# Patient Record
Sex: Female | Born: 1970 | Race: Black or African American | Hispanic: No | Marital: Single | Smoking: Never smoker
Health system: Southern US, Community
[De-identification: ages and names within clinical notes are randomized; demographics above are authoritative.]

## PROBLEM LIST (undated history)

## (undated) DIAGNOSIS — Z789 Other specified health status: Secondary | ICD-10-CM

## (undated) HISTORY — PX: OTHER SURGICAL HISTORY: SHX169

## (undated) HISTORY — DX: Other specified health status: Z78.9

---

## 2012-05-07 ENCOUNTER — Emergency Department: Payer: Self-pay | Admitting: Emergency Medicine

## 2012-05-07 LAB — BASIC METABOLIC PANEL
BUN: 8 mg/dL (ref 7–18)
Calcium, Total: 8.9 mg/dL (ref 8.5–10.1)
Chloride: 107 mmol/L (ref 98–107)
Co2: 27 mmol/L (ref 21–32)
EGFR (African American): >60
Glucose: 90 mg/dL (ref 65–99)
Osmolality: 281 (ref 275–301)
Potassium: 3.9 mmol/L (ref 3.5–5.1)

## 2012-05-07 LAB — CBC
HGB: 12.4 g/dL (ref 12.0–16.0)
MCH: 30.7 pg (ref 26.0–34.0)
MCHC: 34.8 g/dL (ref 32.0–36.0)
WBC: 7.7 10*3/uL (ref 3.6–11.0)

## 2012-06-09 ENCOUNTER — Ambulatory Visit: Payer: Self-pay

## 2013-06-14 ENCOUNTER — Ambulatory Visit: Payer: Self-pay

## 2018-03-09 ENCOUNTER — Encounter: Payer: Self-pay | Admitting: Podiatry

## 2018-03-13 NOTE — Progress Notes (Signed)
This encounter was created in error - please disregard.

## 2018-09-08 ENCOUNTER — Encounter: Payer: Self-pay | Admitting: *Deleted

## 2018-09-08 ENCOUNTER — Ambulatory Visit: Payer: Self-pay | Attending: Oncology | Admitting: *Deleted

## 2018-09-08 ENCOUNTER — Encounter (INDEPENDENT_AMBULATORY_CARE_PROVIDER_SITE_OTHER): Payer: Self-pay

## 2018-09-08 VITALS — BP 129/83 | HR 76 | Temp 97.9°F | Ht 67.75 in | Wt 126.2 lb

## 2018-09-08 DIAGNOSIS — N63 Unspecified lump in unspecified breast: Secondary | ICD-10-CM

## 2018-09-08 NOTE — Progress Notes (Signed)
  Subjective:     Patient ID: Christina Navarro, female   DOB: Aug 28, 1970, 48 y.o.   MRN: [id number]  HPI   Review of Systems     Objective:   Physical Exam Chest:     Breasts:        Right: No swelling, bleeding, inverted nipple, mass, nipple discharge, skin change or tenderness.        Left: Mass and tenderness present. No swelling, bleeding, inverted nipple, nipple discharge or skin change.          Assessment:     48 year old Black female referred to [facility] by [facility] for screening mammogram.  Patient complains of left breast pain.  States she noticed the pain about 2 years ago after her daughters accident.  Her daughter is now a quadriplegic.  States it's intermittent and rates a 4 1/2 to 5 on a 0/10 scale.  No aggravating or alleviating factors.  States "Its just hard to explain, but I am just more aware of it now."  Family history of breast cancer in 3 paternal aunts.  On clinical breast exam I can palpate a very tender approximate 0.5cm at 1:00 left breast 7 cm from the nipple.  This is the area of the patients targeted pain.  Taught self breast awareness.  Last pap per notes from [facility] states normal pap in April 2019.  Next pap will be due per guidelines.  Patient has been screened for eligibility.  She does not have any insurance, Medicare or Medicaid.  She also meets financial eligibility.  Hand-out given on the Affordable Care Act. Risk Assessment    Risk Scores      09/08/2018   Last edited by: Alta Corning, CMA   5-year risk: 0.9 %   Lifetime risk: 9.2 %              Plan:     Will get bilateral diagnostic mammogram and ultrasound.  Will follow-up per [facility] protocol.

## 2018-09-08 NOTE — Patient Instructions (Signed)
Gave patient hand-out, Women Staying Healthy, Active and Well from [facility], with education on breast health, pap smears, heart and colon health. 

## 2018-09-15 ENCOUNTER — Ambulatory Visit
Admission: RE | Admit: 2018-09-15 | Discharge: 2018-09-15 | Disposition: A | Discharge status: Home / Self Care | Payer: Self-pay | Source: Ambulatory Visit | Attending: Oncology | Admitting: Oncology

## 2018-09-15 ENCOUNTER — Encounter (HOSPITAL_COMMUNITY): Payer: Self-pay

## 2018-09-15 DIAGNOSIS — N63 Unspecified lump in unspecified breast: Secondary | ICD-10-CM

## 2018-09-16 ENCOUNTER — Encounter: Payer: Self-pay | Admitting: *Deleted

## 2018-09-16 NOTE — Progress Notes (Signed)
Called patient to review her normal mammogram results.  States she is still have targeted breast pain.  Discussed that since there was a palpable finding at 1:00 left breast at the site of targeted pain, I felt it would be prudent to send her for a surgical consult.  Patient is agreeable.  She is scheduled to see Dr. Lemar Livings on 09/23/18 @ 1:30.

## 2018-09-23 ENCOUNTER — Ambulatory Visit: Payer: Self-pay | Admitting: General Surgery

## 2018-10-05 ENCOUNTER — Encounter: Payer: Self-pay | Admitting: General Surgery

## 2018-10-05 ENCOUNTER — Other Ambulatory Visit: Payer: Self-pay

## 2018-10-05 ENCOUNTER — Ambulatory Visit: Payer: Self-pay

## 2018-10-05 ENCOUNTER — Ambulatory Visit (INDEPENDENT_AMBULATORY_CARE_PROVIDER_SITE_OTHER): Payer: Self-pay | Admitting: General Surgery

## 2018-10-05 VITALS — BP 109/66 | HR 81 | Temp 97.7°F | Resp 16 | Ht 67.0 in | Wt 230.6 lb

## 2018-10-05 DIAGNOSIS — N6321 Unspecified lump in the left breast, upper outer quadrant: Secondary | ICD-10-CM

## 2018-10-05 NOTE — Progress Notes (Signed)
Patient ID: Christina Navarro, female   DOB: 1970-08-30, 48 y.o.   MRN: [id number]  Chief Complaint  Patient presents with  . Breast Problem    left breast    HPI Christina Navarro is a 48 y.o. female.  who presents for a breast evaluation referred by Molli Posey RN with [facility]. The most recent mammogram and left breast ultrasound was done on 09-15-18.  Patient does perform regular self breast checks and gets regular mammograms done.   She states she noticed some discomfort in upper left breast on and off for about 2 years. She thought it occurred more with her menstrual cycles. [name] did notice a lump on exam. She is caregiver to her daughter who is a quadriplegic from a MVA. 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   HPI  Past Medical History:  Diagnosis Date  . Patient denies medical problems     Past Surgical History:  Procedure Laterality Date  . denies      Family History  Problem Relation Age of Onset  . Breast cancer Paternal Aunt        x5  . Colon cancer Neg Hx     Social History Social History   Tobacco Use  . Smoking status: Never Smoker  . Smokeless tobacco: Never Used  Substance Use Topics  . Alcohol use: Never    Frequency: Never  . Drug use: Never    No Known Allergies  Current Outpatient Medications  Medication Sig Dispense Refill  . Ascorbic Acid (VITAMIN C) 1000 MG tablet Take 1,000 mg by mouth daily.    . calcium-vitamin D (OSCAL WITH D) 500-200 MG-UNIT tablet Take 1 tablet by mouth daily.    . Cinnamon 500 MG capsule Take 500 mg by mouth daily.    . Multiple Vitamin (MULTIVITAMIN) capsule Take 1 capsule by mouth daily.    . vitamin B-12 (CYANOCOBALAMIN) 100 MCG tablet Take 100 mcg by mouth daily.     No current facility-administered medications for this visit.     Review of Systems Review of Systems  Constitutional: Negative.   Respiratory: Negative.   Cardiovascular: Negative.     Blood pressure 109/66, pulse 81, temperature 97.7 F (36.5 C),  temperature source Skin, resp. rate 16, height 5\' 7"  (1.702 m), weight 230 lb 9.6 oz (104.6 kg), last menstrual period 10/05/2018, SpO2 97 %.  Physical Exam Physical Exam Exam conducted with a chaperone present.  Constitutional:      Appearance: She is well-developed.  Eyes:     General: No scleral icterus.    Conjunctiva/sclera: Conjunctivae normal.  Neck:     Musculoskeletal: Neck supple.  Cardiovascular:     Rate and Rhythm: Normal rate and regular rhythm.     Heart sounds: Normal heart sounds.  Pulmonary:     Effort: Pulmonary effort is normal.     Breath sounds: Normal breath sounds.  Chest:     Breasts:        Right: No inverted nipple, mass, nipple discharge, skin change or tenderness.        Left: Tenderness present. No inverted nipple, mass, nipple discharge or skin change.    Lymphadenopathy:     Cervical: No cervical adenopathy.     Upper Body:     Right upper body: No supraclavicular or axillary adenopathy.     Left upper body: No supraclavicular or axillary adenopathy.  Skin:    General: Skin is warm and dry.  Neurological:     Mental  Status: She is alert and oriented to person, place, and time.  Psychiatric:        Behavior: Behavior normal.     Data Reviewed Bilateral diagnostic mammogram and left breast ultrasound of September 15, 2018 reviewed.  Focused ultrasound examination of the area of focal tenderness and thickening showed no discernible abnormality.  No images.  BI-RADS-1.  Assessment Long history of breast pain without evidence of physiologic or anatomic pathology.  Plan The patient was reassured that she was appropriate to seek evaluation, but at this time no intervention is necessary or recommended.  She will continue to make sure that her undergarments fit appropriately and she has adequate support during strenuous activity.  She was encouraged to call should she appreciate any new changes.  The patient is aware to call back for any  questions or new concerns. Follow up as needed.   HPI, assessment, plan and physical exam has been scribed under the direction and in the presence of Earline Mayotte, MD. Dorathy Daft, RN  I have completed the exam and reviewed the above documentation for accuracy and completeness.  I agree with the above.  Museum/gallery conservator has been used and any errors in dictation or transcription are unintentional.  Donnalee Curry, M.D., F.A.C.S.  Merrily Pew Alfredia Desanctis 10/05/2018, 8:29 PM

## 2018-10-05 NOTE — Patient Instructions (Signed)
The patient is aware to call back for any questions or new concerns.  

## 2018-10-06 ENCOUNTER — Encounter: Payer: Self-pay | Admitting: *Deleted

## 2018-10-06 NOTE — Progress Notes (Signed)
Patient evaluated by Dr. Lemar Livings after normal mammogram findings.  He has recommended to return to routine screening in one year.  [facility] to [place].

## 2020-08-07 ENCOUNTER — Ambulatory Visit
Admission: EM | Admit: 2020-08-07 | Discharge: 2020-08-07 | Disposition: A | Discharge status: Home / Self Care | Payer: 59 | Attending: Family Medicine | Admitting: Family Medicine

## 2020-08-07 ENCOUNTER — Other Ambulatory Visit: Payer: Self-pay

## 2020-08-07 DIAGNOSIS — M545 Low back pain, unspecified: Secondary | ICD-10-CM | POA: Insufficient documentation

## 2020-08-07 LAB — URINALYSIS, COMPLETE (UACMP) WITH MICROSCOPIC
Glucose, UA: NEGATIVE mg/dL
Hgb urine dipstick: NEGATIVE
Leukocytes,Ua: NEGATIVE
Nitrite: NEGATIVE
Protein, ur: 30 mg/dL — AB
Specific Gravity, Urine: >1.03 — ABNORMAL HIGH (ref 1.005–1.030)
pH: 5.5 (ref 5.0–8.0)

## 2020-08-07 MED ORDER — PREDNISONE 10 MG PO TABS: ORAL_TABLET | ORAL | 0 refills | Status: DC

## 2020-08-07 MED ORDER — TIZANIDINE HCL 4 MG PO TABS: 4.0000 mg | ORAL_TABLET | Freq: Three times a day (TID) | ORAL | 0 refills | Status: DC | PRN

## 2020-08-07 NOTE — ED Provider Notes (Signed)
[facility]    CSN: [id number] Arrival date & time: 08/07/20  5176      History   Chief Complaint Chief Complaint  Patient presents with  . Back Pain   HPI  50 year old female presents with low back pain.  Patient states that she has had ongoing low back pain for greater than 2 weeks.  She states that it seems to be worsening which prompted her visit today.  Located bilaterally.  Pain 6/10 in severity.  Denies fall, trauma, injury.  No recent heavy lifting.  Patient states that she had some stinging with urination this morning.  No fever.  She has taken ibuprofen without resolution.  No known exacerbating factors.  No other complaints.  Home Medications    Prior to Admission medications   Medication Sig Start Date End Date Taking? Authorizing Provider  predniSONE (DELTASONE) 10 MG tablet 50 mg daily x 2 days, then 40 mg daily x 2 days, then 30 mg daily x 2 days, then 20 mg daily x 2 days, then 10 mg daily x 2 days. 08/07/20  Yes Evalina Tabak G, DO  tiZANidine (ZANAFLEX) 4 MG tablet Take 1 tablet (4 mg total) by mouth every 8 (eight) hours as needed for muscle spasms. 08/07/20  Yes Mettie Roylance G, DO  Ascorbic Acid (VITAMIN C) 1000 MG tablet Take 1,000 mg by mouth daily.    [provider]  calcium-vitamin D (OSCAL WITH D) 500-200 MG-UNIT tablet Take 1 tablet by mouth daily.    [provider]  Cinnamon 500 MG capsule Take 500 mg by mouth daily.    [provider]  Multiple Vitamin (MULTIVITAMIN) capsule Take 1 capsule by mouth daily.    [provider]  vitamin B-12 (CYANOCOBALAMIN) 100 MCG tablet Take 100 mcg by mouth daily.    [provider]    Family History Family History  Problem Relation Age of Onset  . Breast cancer Paternal Aunt        x5  . Colon cancer Neg Hx     Social History Social History   Tobacco Use  . Smoking status: Never Smoker  . Smokeless tobacco: Never Used  Substance Use Topics  . Alcohol use:  Never  . Drug use: Never     Allergies   Patient has no known allergies.   Review of Systems Review of Systems Per HPI  Physical Exam Triage Vital Signs ED Triage Vitals  Enc Vitals Group     BP 08/07/20 1005 (!) 145/101     Pulse Rate 08/07/20 1005 84     Resp 08/07/20 1005 16     Temp 08/07/20 1005 98.4 F (36.9 C)     Temp Source 08/07/20 1005 Oral     SpO2 08/07/20 1005 100 %     Weight --      Height 08/07/20 0958 5\' 6"  (1.676 m)     Head Circumference --      Peak Flow --      Pain Score 08/07/20 0957 6     Pain Loc --      Pain Edu? --      Excl. in GC? --    Updated Vital Signs BP (!) 145/101   Pulse 84   Temp 98.4 F (36.9 C) (Oral)   Resp 16   Ht 5\' 6"  (1.676 m)   LMP 07/26/2020   SpO2 100%   BMI 37.22 kg/m   Visual Acuity Right Eye Distance:  Left Eye Distance:   Bilateral Distance:    Right Eye Near:   Left Eye Near:    Bilateral Near:     Physical Exam Vitals and nursing note reviewed.  Constitutional:      General: She is not in acute distress.    Appearance: Normal appearance. She is not ill-appearing.  HENT:     Head: Normocephalic and atraumatic.  Eyes:     General:        Right eye: No discharge.        Left eye: No discharge.     Conjunctiva/sclera: Conjunctivae normal.  Cardiovascular:     Rate and Rhythm: Normal rate and regular rhythm.  Pulmonary:     Effort: Pulmonary effort is normal. No respiratory distress.  Musculoskeletal:     Comments: A low back with bilateral paraspinal musculature tenderness to palpation.  Left greater than right.  Neurological:     Mental Status: She is alert.  Psychiatric:        Mood and Affect: Mood normal.        Behavior: Behavior normal.    UC Treatments / Results  Labs (all labs ordered are listed, but only abnormal results are displayed) Labs Reviewed  URINALYSIS, COMPLETE (UACMP) WITH MICROSCOPIC - Abnormal; Notable for the following components:      Result Value   Specific  Gravity, Urine >1.030 (*)    Bilirubin Urine SMALL (*)    Ketones, ur TRACE (*)    Protein, ur 30 (*)    Bacteria, UA FEW (*)    All other components within normal limits    EKG   Radiology No results found.  Procedures Procedures (including critical care time)  Medications Ordered in UC Medications - No data to display  Initial Impression / Assessment and Plan / UC Course  I have reviewed the triage vital signs and the nursing notes.  Pertinent labs & imaging results that were available during my care of the patient were reviewed by me and considered in my medical decision making (see chart for details).    50 year old female presents with a low back pain.  No evidence of UTI.  MSK in nature.  Zanaflex and prednisone as prescribed.  Final Clinical Impressions(s) / UC Diagnoses   Final diagnoses:  Acute bilateral low back pain without sciatica   Discharge Instructions   None    ED Prescriptions    Medication Sig Dispense Auth. Provider   predniSONE (DELTASONE) 10 MG tablet 50 mg daily x 2 days, then 40 mg daily x 2 days, then 30 mg daily x 2 days, then 20 mg daily x 2 days, then 10 mg daily x 2 days. 30 tablet Mande Auvil G, DO   tiZANidine (ZANAFLEX) 4 MG tablet Take 1 tablet (4 mg total) by mouth every 8 (eight) hours as needed for muscle spasms. 30 tablet Tommie Sams, DO     PDMP not reviewed this encounter.   Tommie Sams, [place] 08/07/20 1117

## 2020-08-07 NOTE — ED Triage Notes (Signed)
Pt reports having lower back pain x1 week. Also reports having burning with urination. Have taken otc meds without relief.

## 2020-10-09 IMAGING — MG DIGITAL DIAGNOSTIC BILATERAL MAMMOGRAM WITH TOMO AND CAD
6 series · 6 of 14 positions shown · non-contrast
Comparison: Previous exam(s).

CLINICAL DATA: Left breast upper outer quadrant area of tenderness
and palpable concern described by the patient.

EXAM:
DIGITAL DIAGNOSTIC BILATERAL MAMMOGRAM WITH CAD AND TOMO
ULTRASOUND LEFT BREAST

[R CV synth-2D]
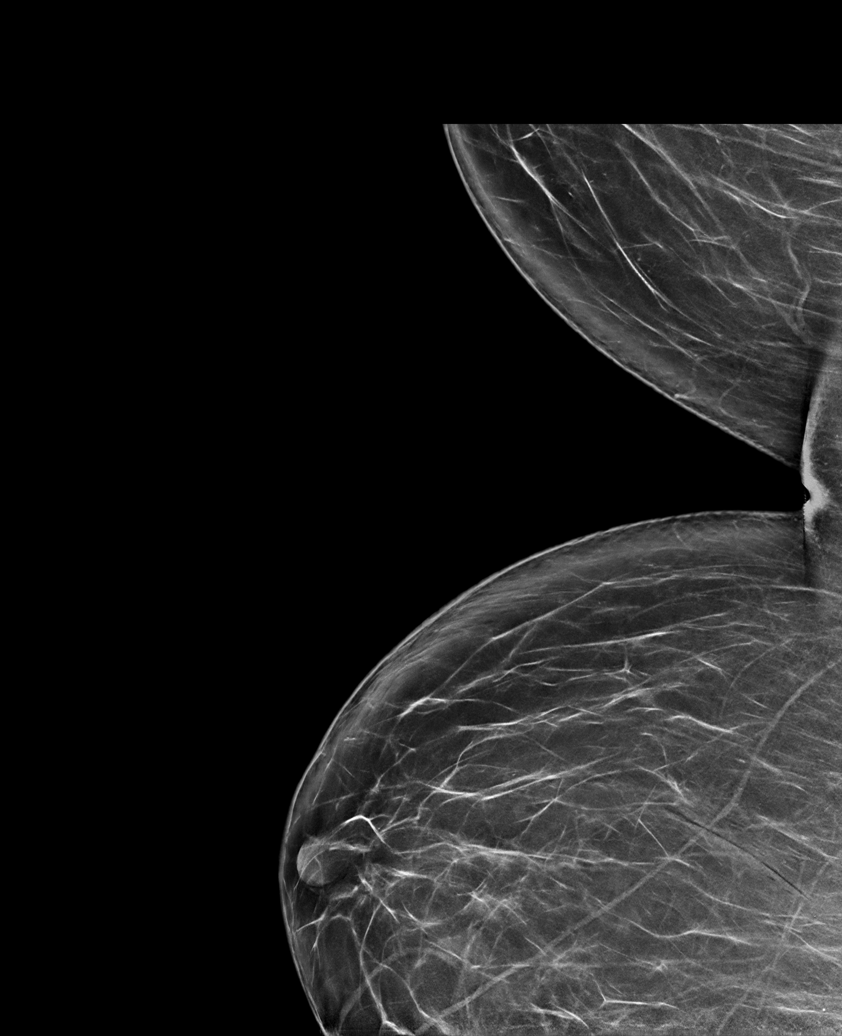

[R CC synth-2D]
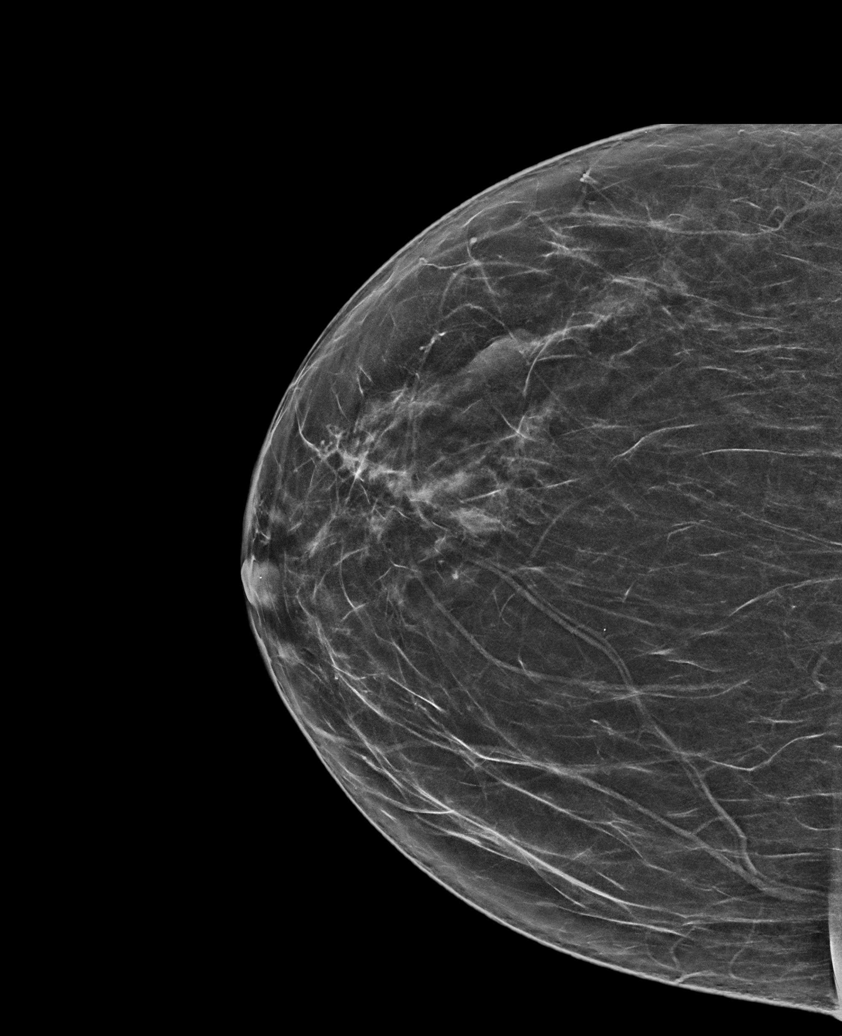

[L TAN synth-2D]
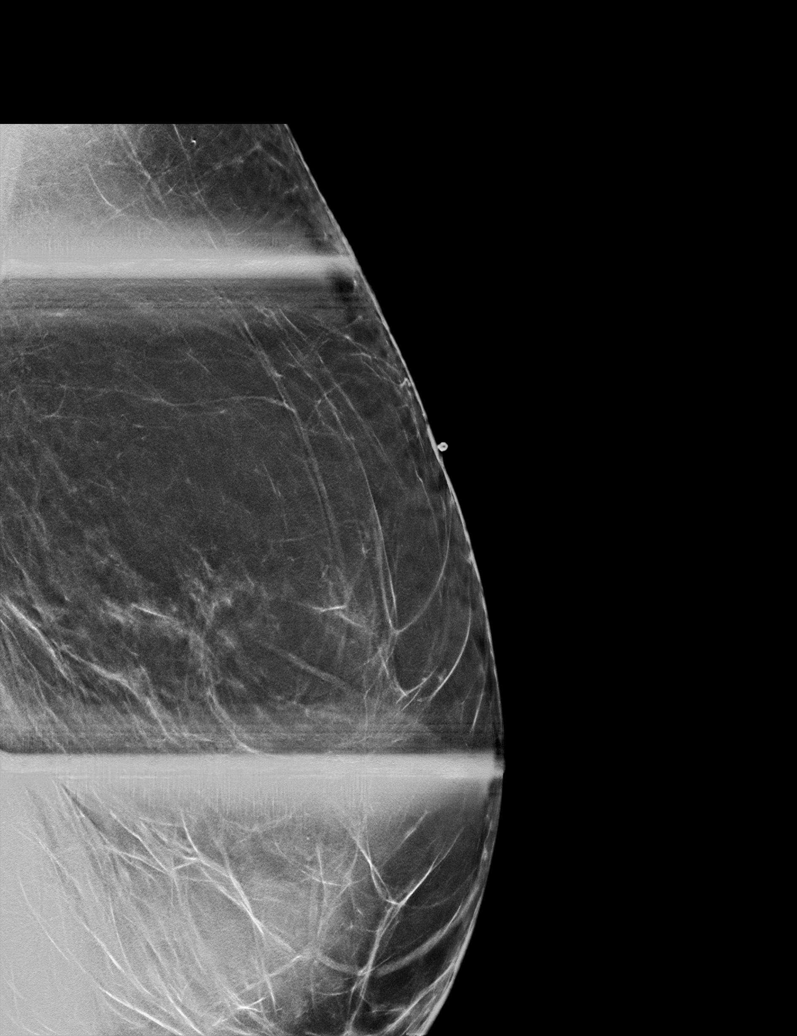

[L CC synth-2D]
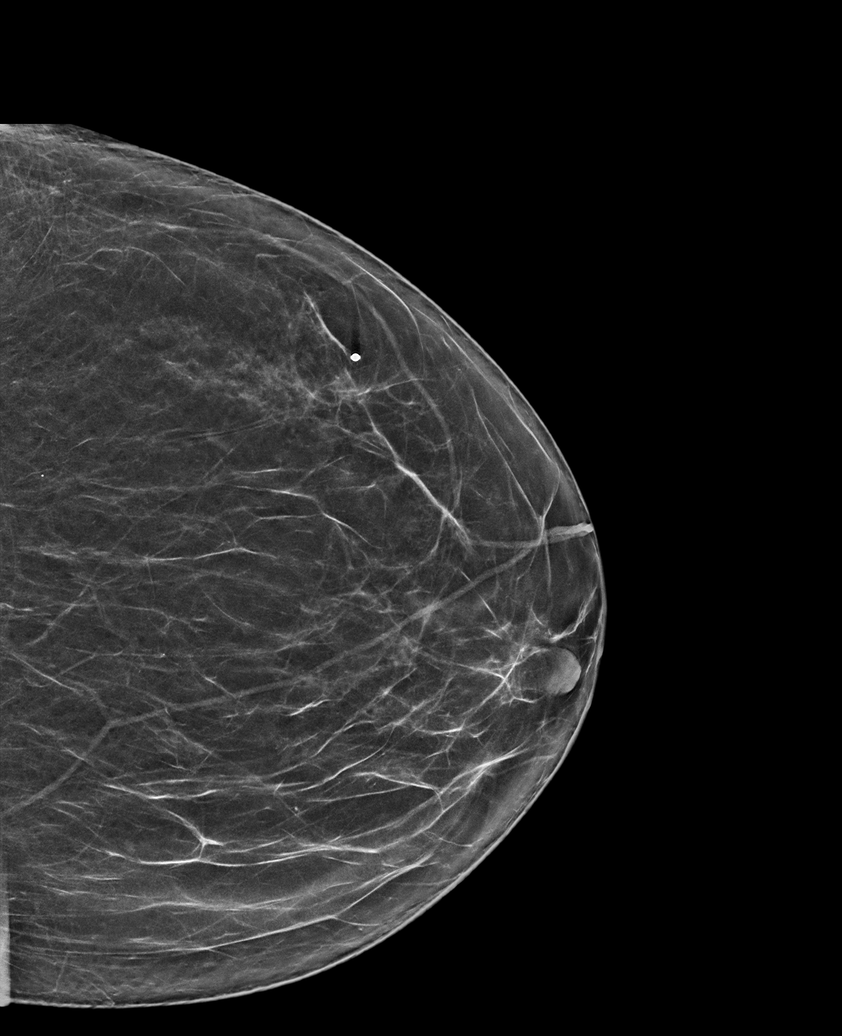

[L MLO tomo · tomo slice 43/86.0]
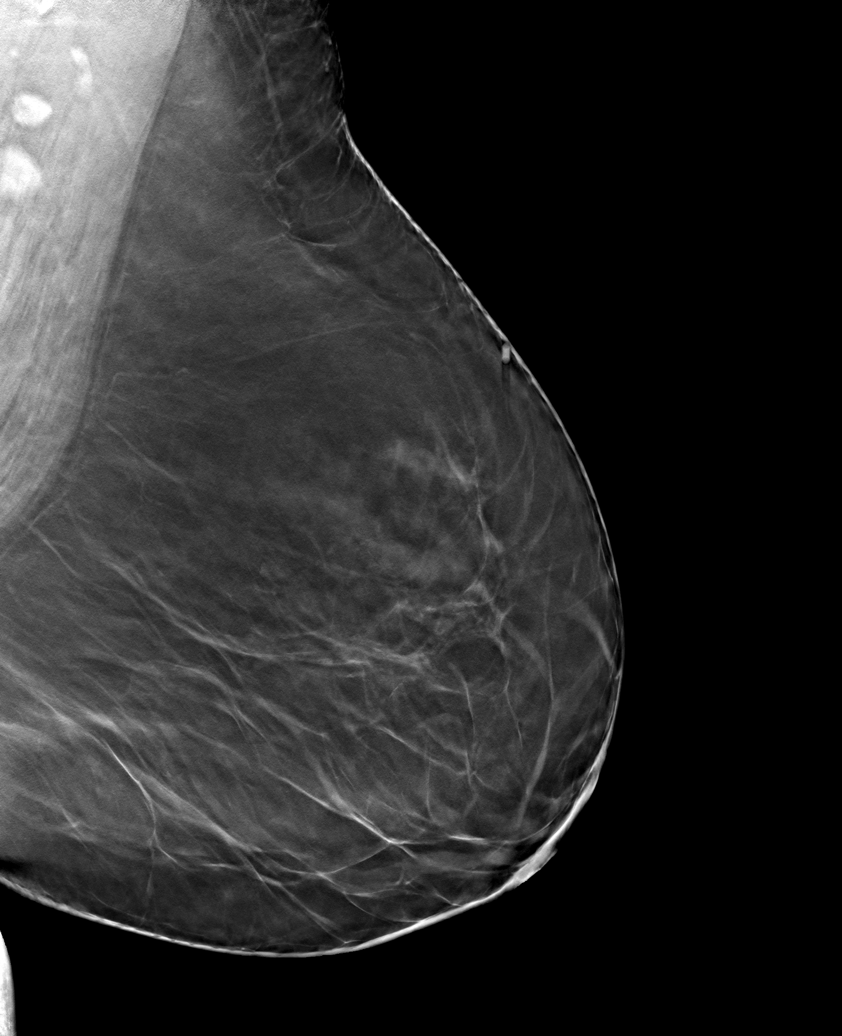

[L CC tomo · tomo slice 37/72.0]
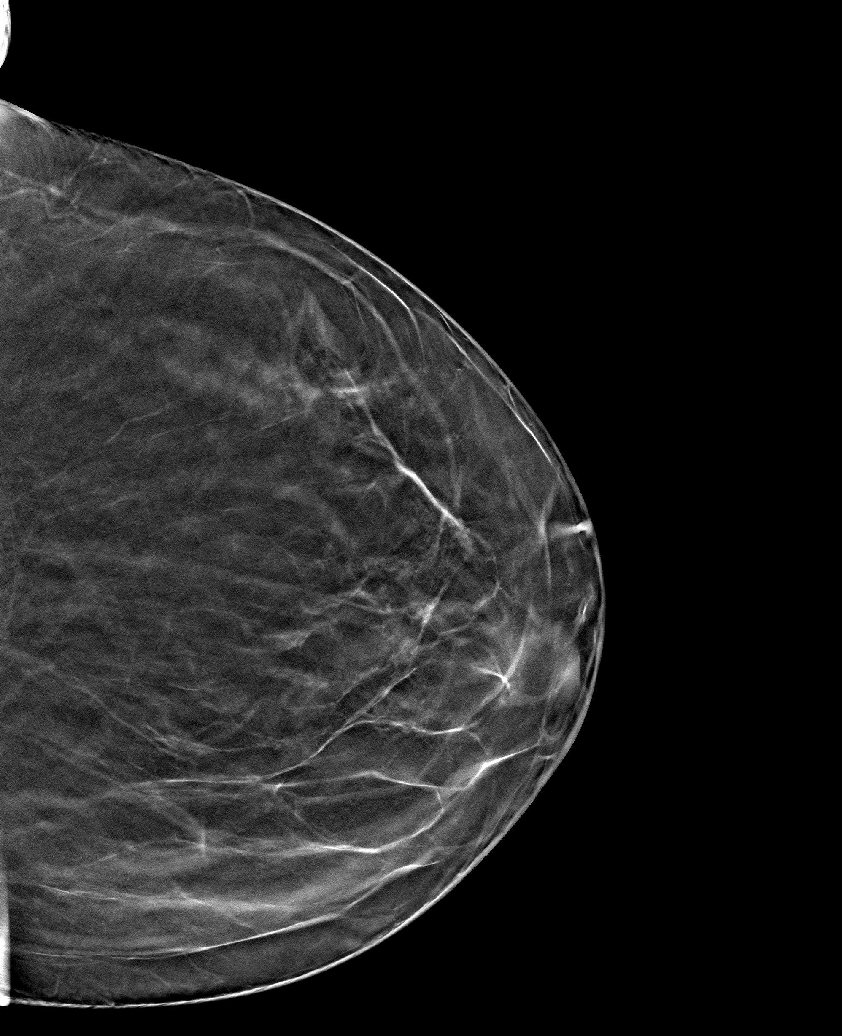

[6 of 14 positions shown; findings below may reference images not displayed]

ACR Breast Density Category b: There are scattered areas of
fibroglandular density.
FINDINGS: Mammographically, there are no suspicious masses, areas of
architectural distortion or microcalcifications in either breast.
The area of tenderness/palpable concern in the left breast upper
outer quadrant is marked with a BB.

Mammographic images were processed with CAD.

On physical exam, no suspicious masses are palpated.

Targeted ultrasound is performed, showing no suspicious masses or
shadowing lesions at the site of tenderness in the left breast upper
outer quadrant.
IMPRESSION: No mammographic sonographic evidence of malignancy in either breast.

RECOMMENDATION:
Further management of patient's left breast area of
tenderness/palpable concern should be based on clinical grounds.

Otherwise, Screening mammogram in one year.(Code:OS-G-1UC)

I have discussed the findings and recommendations with the patient.
Results were also provided in writing at the conclusion of the
visit. If applicable, a reminder letter will be sent to the patient
regarding the next appointment.

BI-RADS CATEGORY  1: Negative.

## 2020-11-25 ENCOUNTER — Other Ambulatory Visit: Payer: Self-pay | Admitting: Family Medicine

## 2021-06-14 ENCOUNTER — Other Ambulatory Visit: Payer: Self-pay | Admitting: Family Medicine

## 2021-06-14 DIAGNOSIS — Z1231 Encounter for screening mammogram for malignant neoplasm of breast: Secondary | ICD-10-CM

## 2022-11-02 ENCOUNTER — Ambulatory Visit (INDEPENDENT_AMBULATORY_CARE_PROVIDER_SITE_OTHER): Payer: Medicaid Other

## 2022-11-02 ENCOUNTER — Ambulatory Visit
Admission: EM | Admit: 2022-11-02 | Discharge: 2022-11-02 | Disposition: A | Discharge status: Home / Self Care | Payer: Medicaid Other | Attending: Physician Assistant | Admitting: Physician Assistant

## 2022-11-02 DIAGNOSIS — R202 Paresthesia of skin: Secondary | ICD-10-CM | POA: Insufficient documentation

## 2022-11-02 DIAGNOSIS — M545 Low back pain, unspecified: Secondary | ICD-10-CM | POA: Insufficient documentation

## 2022-11-02 DIAGNOSIS — I493 Ventricular premature depolarization: Secondary | ICD-10-CM | POA: Diagnosis present

## 2022-11-02 DIAGNOSIS — R0602 Shortness of breath: Secondary | ICD-10-CM

## 2022-11-02 DIAGNOSIS — R519 Headache, unspecified: Secondary | ICD-10-CM | POA: Insufficient documentation

## 2022-11-02 LAB — URINALYSIS, W/ REFLEX TO CULTURE (INFECTION SUSPECTED)
Bacteria, UA: NONE SEEN
Bilirubin Urine: NEGATIVE
Glucose, UA: NEGATIVE mg/dL
Hgb urine dipstick: NEGATIVE
Ketones, ur: NEGATIVE mg/dL
Leukocytes,Ua: NEGATIVE
Nitrite: NEGATIVE
Protein, ur: NEGATIVE mg/dL
Specific Gravity, Urine: 1.02 (ref 1.005–1.030)
WBC, UA: NONE SEEN WBC/hpf (ref 0–5)
pH: 8.5 — ABNORMAL HIGH (ref 5.0–8.0)

## 2022-11-02 MED ORDER — ACETAMINOPHEN 325 MG PO TABS
975.0000 mg | ORAL_TABLET | Freq: Once | ORAL | Status: AC
  Administered 2022-11-02: 975 mg via ORAL

## 2022-11-02 MED ORDER — NAPROXEN 500 MG PO TABS: 500.0000 mg | ORAL_TABLET | Freq: Two times a day (BID) | ORAL | 0 refills | Status: AC

## 2022-11-02 MED ORDER — TIZANIDINE HCL 4 MG PO TABS: 4.0000 mg | ORAL_TABLET | Freq: Three times a day (TID) | ORAL | 0 refills | Status: AC | PRN

## 2022-11-02 NOTE — ED Provider Notes (Signed)
[facility]    CSN: [id number] Arrival date & time: 11/02/22  1231      History   Chief Complaint Chief Complaint  Patient presents with   Back Pain   Numbness    HPI Christina Navarro is a 52 y.o. female presenting with multiple complaints.  First she reports her back has been hurting on the right side for the past 3 days.  Denies injury.  States she woke up and it was hurting.  Pain is about 7 out of 10.  It does not radiate.  No associated numbness, weakness or tingling of her legs.  Patient reports she has had pain in her back off and on for a while.  Reports a little bit of burning with urination as well and would like to be checked for UTI.  Denies frequency or urgency, fever, chills.  No report of abdominal pain, nausea or vomiting.  Additionally, patient reports over the past 2 days she has had a mild frontal headache which she rates at 2-3 out of 10.  It has been associated with numbness of the right side of her face, right side of her neck and right upper arm.  Patient reports no associated vision changes, speech or balance problems, dizziness, difficulty swallowing, feeling faint, syncope.  Denies history of cardiac disease.  States she has had high blood pressure before and used to be on blood pressure medication but is not currently on any BP medication.  Blood pressure currently is 149/95.  She says she took Tylenol for her headache and it is nearly resolved.  She says it also seem to improve the numbness sensation she feels on her face.  Her face is not drooping.  She does not feel confused.  She does not have a history of migraines and has never had symptoms like this before.  Next, patient reports feeling short of breath over the past few days.  She denies any associated fever, cough, congestion, chest pain, palpitations, leg swelling.  Denies any history of respiratory disease.  Patient says that she has been prescribed an inhaler before but she does not know why.  No  other concerns.  HPI  Past Medical History:  Diagnosis Date   Patient denies medical problems     Patient Active Problem List   Diagnosis Date Noted   Mass of upper outer quadrant of left breast 10/05/2018    Past Surgical History:  Procedure Laterality Date   denies      OB History     Gravida  2   Para  2   Term      Preterm      AB      Living         SAB      IAB      Ectopic      Multiple      Live Births           Obstetric Comments  1st Menstrual Cycle: 13  1st Pregnancy:  21           Home Medications    Prior to Admission medications   Medication Sig Start Date End Date Taking? Authorizing Provider  Ascorbic Acid (VITAMIN C) 1000 MG tablet Take 1,000 mg by mouth daily.   Yes [provider]  calcium-vitamin D (OSCAL WITH D) 500-200 MG-UNIT tablet Take 1 tablet by mouth daily.   Yes [provider]  chlorthalidone (HYGROTON) 25 MG tablet TAKE 1/2 (ONE  HALF) TABLET BY MOUTH ONCE DAILY FOR HIGH BLOOD PRESSURE 04/05/20  Yes [provider]  Cinnamon 500 MG capsule Take 500 mg by mouth daily.   Yes [provider]  Multiple Vitamin (MULTIVITAMIN) capsule Take 1 capsule by mouth daily.   Yes [provider]  naproxen (NAPROSYN) 500 MG tablet Take 1 tablet (500 mg total) by mouth 2 (two) times daily. 11/02/22  Yes Laurene Footman B, PA-C  tiZANidine (ZANAFLEX) 4 MG tablet Take 1 tablet (4 mg total) by mouth every 8 (eight) hours as needed for up to 7 days for muscle spasms (back pain). 11/02/22 11/09/22 Yes Danton Clap, PA-C  vitamin B-12 (CYANOCOBALAMIN) 100 MCG tablet Take 100 mcg by mouth daily.   Yes [provider]    Family History Family History  Problem Relation Age of Onset   Breast cancer Paternal Aunt        x5   Colon cancer Neg Hx     Social History Social History   Tobacco Use   Smoking status: Never   Smokeless tobacco: Never  Vaping Use   Vaping Use: Never used   Substance Use Topics   Alcohol use: Never   Drug use: Never     Allergies   Patient has no known allergies.   Review of Systems Review of Systems  Constitutional:  Negative for chills, diaphoresis, fatigue and fever.  HENT:  Negative for congestion, ear pain, rhinorrhea, sinus pressure, sinus pain and sore throat.   Eyes:  Negative for photophobia and visual disturbance.  Respiratory:  Positive for shortness of breath. Negative for cough.   Cardiovascular:  Negative for chest pain and palpitations.  Gastrointestinal:  Negative for abdominal pain, nausea and vomiting.  Genitourinary:  Positive for dysuria. Negative for difficulty urinating, frequency and urgency.  Musculoskeletal:  Positive for back pain and neck pain. Negative for arthralgias, myalgias and neck stiffness.  Skin:  Negative for rash.  Neurological:  Positive for numbness and headaches. Negative for dizziness, syncope, facial asymmetry, speech difficulty, weakness and light-headedness.  Hematological:  Negative for adenopathy.  Psychiatric/Behavioral:  Negative for confusion.      Physical Exam Triage Vital Signs ED Triage Vitals  Enc Vitals Group     BP      Pulse      Resp      Temp      Temp src      SpO2      Weight      Height      Head Circumference      Peak Flow      Pain Score      Pain Loc      Pain Edu?      Excl. in [place]?    No data found.  Updated Vital Signs BP (!) 149/95 (BP Location: Left Arm)   Pulse 75   Temp 97.9 F (36.6 C) (Oral)   Ht 5\' 6"  (1.676 m)   LMP 10/18/2022   SpO2 97%   BMI 37.22 kg/m    Physical Exam Vitals and nursing note reviewed.  Constitutional:      General: She is not in acute distress.    Appearance: Normal appearance. She is not ill-appearing or toxic-appearing.  HENT:     Head: Normocephalic and atraumatic.     Right Ear: Tympanic membrane, ear canal and external ear normal.     Left Ear: Tympanic membrane, ear canal and external ear normal.  Nose: Nose normal.     Mouth/Throat:     Mouth: Mucous membranes are moist.     Pharynx: Oropharynx is clear.  Eyes:     General: No scleral icterus.       Right eye: No discharge.        Left eye: No discharge.     Extraocular Movements: Extraocular movements intact.     Conjunctiva/sclera: Conjunctivae normal.     Pupils: Pupils are equal, round, and reactive to light.  Cardiovascular:     Rate and Rhythm: Normal rate and regular rhythm.     Heart sounds: Normal heart sounds.  Pulmonary:     Effort: Pulmonary effort is normal. No respiratory distress.     Breath sounds: Normal breath sounds.  Abdominal:     Palpations: Abdomen is soft.     Tenderness: There is no abdominal tenderness. There is no right CVA tenderness or left CVA tenderness.  Musculoskeletal:     Cervical back: Neck supple. Tenderness (Mild TTP right paracervical muscles) present.     Lumbar back: Tenderness (right paralumbar muscles) present. No bony tenderness. Decreased range of motion. Negative right straight leg raise test and negative left straight leg raise test.  Skin:    General: Skin is dry.  Neurological:     General: No focal deficit present.     Mental Status: She is alert and oriented to person, place, and time. Mental status is at baseline.     Cranial Nerves: No cranial nerve deficit.     Sensory: Sensory deficit (Reporting reduced sensation to light touch of the right side of face) present.     Motor: No weakness.     Coordination: Coordination normal.     Gait: Gait normal.     Comments: 5/5 strength bilateral upper and lower exts. Normal nose to finger testing and rapid alternating supination/pronation of hands.   Psychiatric:        Mood and Affect: Mood normal.        Behavior: Behavior normal.        Thought Content: Thought content normal.      UC Treatments / Results  Labs (all labs ordered are listed, but only abnormal results are displayed) Labs Reviewed  URINALYSIS, W/ REFLEX  TO CULTURE (INFECTION SUSPECTED) - Abnormal; Notable for the following components:      Result Value   pH 8.5 (*)    All other components within normal limits    EKG   Radiology DG Chest 2 View  Result Date: 11/02/2022 CLINICAL DATA:  Short of breath. EXAM: CHEST - 2 VIEW COMPARISON:  12/04/2014 FINDINGS: The heart size and mediastinal contours are within normal limits. Both lungs are clear. The visualized skeletal structures are unremarkable. IMPRESSION: No active cardiopulmonary disease. Electronically Signed   By: Kerby Moors M.D.   On: 11/02/2022 13:31    Procedures ED EKG  Date/Time: 11/02/2022 1:17 PM  Performed by: Danton Clap, PA-C Authorized by: Danton Clap, PA-C   Previous ECG:    Previous ECG:  Compared to current   Comparison ECG info:  EKG from 2013 does not have prolonged QT or PVCs Interpretation:    Interpretation: abnormal   Rate:    ECG rate:  74   ECG rate assessment: normal   Rhythm:    Rhythm: sinus rhythm   Ectopy:    Ectopy: PVCs     PVCs:  Infrequent QRS:    QRS axis:  Normal  QRS intervals:  Normal   QRS conduction: normal   ST segments:    ST segments:  Normal T waves:    T waves: non-specific   Other findings:    Other findings: prolonged qTc interval   Comments:     Normal sinus rhythm with occasional PVCs, Prolonged QT  (including critical care time)  Medications Ordered in UC Medications  acetaminophen (TYLENOL) tablet 975 mg (975 mg Oral Given 11/02/22 1354)    Initial Impression / Assessment and Plan / UC Course  I have reviewed the triage vital signs and the nursing notes.  Pertinent labs & imaging results that were available during my care of the patient were reviewed by me and considered in my medical decision making (see chart for details).   52 year old female presenting multiple complaints including mild headache with right-sided facial numbness, right-sided neck pain and right upper shoulder and arm pain with  associated numbness but no weakness.  Also reporting shortness of breath and right-sided lower back pain with slight dysuria.  All symptoms started within the last 3 days.  Patient is most concerned about her back pain and the numbness of her face.  Symptoms have improved a little with use of Tylenol.  No history of cardiopulmonary disease.  Does not take any routine medications.  She denies any falls or injuries, syncope, presyncope, dizziness, facial drooping, difficulty speaking or swallowing.  Balance problems, confusion, chest pain, palpitations, abdominal pain, nausea/vomiting.  No recent illness.  Denies fever, cough, congestion.  BP 149/95.  Other vitals normal and stable.  She is overall well-appearing.  On exam has a completely normal neurological exam other than subjective reduced sensation to the right aspect of her face with light touch to face.  Tenderness of the right side of her neck and back musculature.  Chest clear to auscultation.  Urinalysis obtained.  EKG and chest x-ray ordered given patient's complaint of shortness of breath.   EKG shows normal sinus rhythm with regular rate of 74 bpm.  Occasional PVCs noted as well as prolonged QT and possible nonspecific T wave abnormality.  This compared to EKG from 2013 which did not show PVCs or prolonged QT intervals.  Discussed results EKG with patient.  Discussed different potential causes for PVCs.  She feels it could potentially be related to increased stress.  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  She reports no injury manage caffeine.  Urinalysis without signs of infection.  Reviewed that with patient. Chest x-ray normal.  Reviewed that with patient as well.  At this time, advised patient I cannot 100% rule out intracranial abnormality as we do not have CT scan available in the urgent care.  However, I feel the risk for that is low especially given her normal neurological exam and the fact that the symptoms have improved a little with  Tylenol.  Could be related to complicated migraine/tension type headache.  Additionally, patient has back pain which is likely musculoskeletal.  Will treat conditions with naproxen and tizanidine.  Offered ketorolac injection in clinic but patient declines stating she does not really like the medication.  She does agree to 3 Tylenol.  Detailed instructions given to patient with list of symptoms to look out for which could mean she should go to the emergency department.  Patient is agreeable that if any symptoms worsen or new symptoms develop she will go to the ED for further evaluation.   Final Clinical Impressions(s) / UC Diagnoses   Final diagnoses:  Acute right-sided  low back pain without sciatica  Acute nonintractable headache, unspecified headache type  Facial paresthesia  Shortness of breath  PVC's (premature ventricular contractions)     Discharge Instructions      -Your EKG today showed something called PVCs.  See the handout.  This is usually not serious but if you feel that your heart is racing or fluttering and is not stopping or you have associated chest pain, increased shortness of breath, dizziness, feel weak, you should be seen in the emergency department for further evaluation.  If the shortness of breath feeling continues you need to follow-up with primary care provider for evaluation. -I believe your facial numbness is likely related to tension type headache/complicated migraine and neck pain.  Hopefully the anti-inflammatory medication and muscle relaxer helps this.  You can continue Tylenol as well. - However, if you have worsening or persistent headache, vision changes, difficulty speaking or walking, confusion, difficulty swallowing, nausea/vomiting, drooping of your face, numbness/tingling or weakness of your extremities, those are more signs of potential stroke and you should be seen in the emergency department immediately. -Back pain is likely musculoskeletal.  I sent  an anti-inflammatory medicine muscle relaxer.  You can also use heat, muscle rubs.  This should get better within a couple weeks but if it worsens he should be seen again. -Shortness of breath could be in part due to allergies so you should start taking antihistamine such as Claritin or Zyrtec.  If the breathing worsens he should go to the ER.     ED Prescriptions     Medication Sig Dispense Auth. Provider   naproxen (NAPROSYN) 500 MG tablet Take 1 tablet (500 mg total) by mouth 2 (two) times daily. 30 tablet Laurene Footman B, PA-C   tiZANidine (ZANAFLEX) 4 MG tablet Take 1 tablet (4 mg total) by mouth every 8 (eight) hours as needed for up to 7 days for muscle spasms (back pain). 20 tablet Gretta Cool      PDMP not reviewed this encounter.   Danton Clap, PA-C 11/02/22 1400

## 2022-11-02 NOTE — ED Triage Notes (Signed)
Pt c/o right side numbness going down face, arms, and legs x2days. SOB x1week  Pt denies chest pain but states her pulse does get elevated and she has to sit and rest before moving again.   Pt states that she woke up with a headache and and states her right arm feels asleep and she has a sore spot and bruise in her right leg.   Pt has been waking up with back pain through the night and has been taking Tylenol and it is not helping. Pt is not having a headache currently from using the tylenol.  Pt has soreness in right shoulder.

## 2022-11-02 NOTE — Discharge Instructions (Addendum)
-  Your EKG today showed something called PVCs.  See the handout.  This is usually not serious but if you feel that your heart is racing or fluttering and is not stopping or you have associated chest pain, increased shortness of breath, dizziness, feel weak, you should be seen in the emergency department for further evaluation.  If the shortness of breath feeling continues you need to follow-up with primary care provider for evaluation. -I believe your facial numbness is likely related to tension type headache/complicated migraine and neck pain.  Hopefully the anti-inflammatory medication and muscle relaxer helps this.  You can continue Tylenol as well. - However, if you have worsening or persistent headache, vision changes, difficulty speaking or walking, confusion, difficulty swallowing, nausea/vomiting, drooping of your face, numbness/tingling or weakness of your extremities, those are more signs of potential stroke and you should be seen in the emergency department immediately. -Back pain is likely musculoskeletal.  I sent an anti-inflammatory medicine muscle relaxer.  You can also use heat, muscle rubs.  This should get better within a couple weeks but if it worsens he should be seen again. -Shortness of breath could be in part due to allergies so you should start taking antihistamine such as Claritin or Zyrtec.  If the breathing worsens he should go to the ER.
